# Patient Record
Sex: Female | Born: 1995 | Race: White | Hispanic: No | Marital: Single | Smoking: Never smoker
Health system: Southern US, Community
[De-identification: ages and names within clinical notes are randomized; demographics above are authoritative.]

## PROBLEM LIST (undated history)

## (undated) DIAGNOSIS — J45909 Unspecified asthma, uncomplicated: Secondary | ICD-10-CM

## (undated) DIAGNOSIS — R55 Syncope and collapse: Secondary | ICD-10-CM

## (undated) HISTORY — DX: Syncope and collapse: R55

## (undated) HISTORY — PX: NO PAST SURGERIES: SHX2092

---
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
## 2015-07-31 ENCOUNTER — Encounter (HOSPITAL_COMMUNITY): Payer: Self-pay | Admitting: Emergency Medicine

## 2015-07-31 ENCOUNTER — Emergency Department (HOSPITAL_COMMUNITY)
Admission: EM | Admit: 2015-07-31 | Discharge: 2015-07-31 | Disposition: A | Discharge status: Home / Self Care | Payer: Medicaid Other | Attending: Emergency Medicine | Admitting: Emergency Medicine

## 2015-07-31 ENCOUNTER — Emergency Department (HOSPITAL_COMMUNITY): Payer: Medicaid Other

## 2015-07-31 DIAGNOSIS — Z79899 Other long term (current) drug therapy: Secondary | ICD-10-CM | POA: Diagnosis not present

## 2015-07-31 DIAGNOSIS — Z880 Allergy status to penicillin: Secondary | ICD-10-CM | POA: Diagnosis not present

## 2015-07-31 DIAGNOSIS — R079 Chest pain, unspecified: Secondary | ICD-10-CM | POA: Diagnosis not present

## 2015-07-31 DIAGNOSIS — L02212 Cutaneous abscess of back [any part, except buttock]: Secondary | ICD-10-CM | POA: Diagnosis present

## 2015-07-31 DIAGNOSIS — J45909 Unspecified asthma, uncomplicated: Secondary | ICD-10-CM | POA: Diagnosis not present

## 2015-07-31 DIAGNOSIS — L0291 Cutaneous abscess, unspecified: Secondary | ICD-10-CM

## 2015-07-31 MED ORDER — MORPHINE SULFATE (PF) 4 MG/ML IV SOLN
4.0000 mg | Freq: Once | INTRAVENOUS | Status: AC
  Administered 2015-07-31: 4 mg via INTRAVENOUS
  Filled 2015-07-31: qty 1

## 2015-07-31 MED ORDER — NAPROXEN 500 MG PO TABS: 500.0000 mg | ORAL_TABLET | Freq: Two times a day (BID) | ORAL | Status: DC

## 2015-07-31 MED ORDER — LIDOCAINE-EPINEPHRINE (PF) 2 %-1:200000 IJ SOLN
20.0000 mL | Freq: Once | INTRAMUSCULAR | Status: AC
  Administered 2015-07-31: 20 mL
  Filled 2015-07-31: qty 20

## 2015-07-31 NOTE — ED Notes (Addendum)
Pt states that she has a boil between her thigh and buttocks that has been present for 3 days.  Also reports chest pain that started last night, intermittent in nature, worsens with movement, and is not currently present.

## 2015-07-31 NOTE — ED Provider Notes (Signed)
THIS IS A SHARED VISIT WITH DR. Jonny Ruiz KNAPP  Emma Cooper is a 20 y.o. female with an abscess to the left buttock.   BP 116/65 mmHg  Pulse 96  Temp(Src) 97.6 F (36.4 C) (Oral)  Resp 16  Ht  (1.549 m)  Wt 68.04 kg  BMI 28.36 kg/m2  SpO2 99%  LMP 07/17/2015   INCISION AND DRAINAGE Performed by: Dariel Betzer Consent: Verbal consent obtained. Risks and benefits: risks, benefits and alternatives were discussed Type: abscess  Body area: left buttock  Cleaned and prepped   Draped   Anesthesia: local infiltration  Local anesthetic: lidocaine 2% with epinephrine  Anesthetic total: 4 ml  Complexity: complex Blunt dissection to break up loculations  Irrigated with NSS  Drainage: purulent  Drainage amount: moderate  Packing material: 1/4 in iodoform gauze  Patient tolerance: Patient tolerated the procedure well with no immediate complications.     [id number] [place], NP 07/31/15 2020  Linwood Dibbles, MD 07/31/15 [phone]

## 2015-07-31 NOTE — ED Notes (Signed)
Pt states understanding of care given and follow up instructions 

## 2015-07-31 NOTE — ED Provider Notes (Signed)
CSN: [id number]     Arrival date & time 07/31/15  1542 History   First MD Initiated Contact with Patient 07/31/15 1843     Chief Complaint  Patient presents with  . Abscess   HPI Patient presents to emergency room with complaints of 2 different issues. The first thing she mentioned was having an episode of chest pain yesterday. It started last night. It was sharp. Intermittent nature. It would increase intermittently with movement. She is not having any trouble with coughing. No shortness of breath. No leg swelling. She does not have a history of heart disease or pulmonary embolism. Today the symptoms are better although she has noticed some intermittent sharp pain.  Patient also has been complaining of a sore area on the right but. Started a few days ago. It increased in severity over the last day. She denies any fevers or chills. Is very painful to sit on that area. Past Medical History  Diagnosis Date  . Asthma    History reviewed. No pertinent past surgical history. History reviewed. No pertinent family history. Social History  Substance Use Topics  . Smoking status: Never Smoker   . Smokeless tobacco: Never Used  . Alcohol Use: No   OB History    Gravida Para Term Preterm AB TAB SAB Ectopic Multiple Living   0 0 0 0 0 0 0 0       Review of Systems  All other systems reviewed and are negative.     Allergies  Amoxicillin and Penicillins  Home Medications   Prior to Admission medications   Medication Sig Start Date End Date Taking? Authorizing Provider  albuterol (PROVENTIL HFA;VENTOLIN HFA) 108 (90 BASE) MCG/ACT inhaler Inhale 2 puffs into the lungs every 4 (four) hours as needed for wheezing. 05/31/12  Yes Ivery Quale, PA-C  BIOTIN PO Take 1 tablet by mouth daily.   Yes Historical Provider, MD  albuterol (PROVENTIL HFA;VENTOLIN HFA) 108 (90 BASE) MCG/ACT inhaler Inhale 2 puffs into the lungs every 4 (four) hours as needed for wheezing or shortness of breath. 03/22/14    John Molpus, MD  albuterol (PROVENTIL) (5 MG/ML) 0.5% nebulizer solution Take 2.5 mg by nebulization every 6 (six) hours as needed for wheezing or shortness of breath.    Historical Provider, MD  diphenhydrAMINE (BENADRYL) 25 MG tablet Take 1 tablet (25 mg total) by mouth every 4 (four) hours as needed for itching. 10/25/14   Tammy Triplett, PA-C  naproxen (NAPROSYN) 500 MG tablet Take 1 tablet (500 mg total) by mouth 2 (two) times daily. 07/31/15   Linwood Dibbles, MD  predniSONE (DELTASONE) 20 MG tablet Take two tablets po qd x 5 days 10/25/14   Tammy Triplett, PA-C   BP 116/65 mmHg  Pulse 96  Temp(Src) 97.6 F (36.4 C) (Oral)  Resp 16  Ht  (1.549 m)  Wt 68.04 kg  BMI 28.36 kg/m2  SpO2 99%  LMP 07/17/2015 Physical Exam  Constitutional: She appears well-developed and well-nourished. No distress.  HENT:  Head: Normocephalic and atraumatic.  Right Ear: External ear normal.  Left Ear: External ear normal.  Eyes: Conjunctivae are normal. Right eye exhibits no discharge. Left eye exhibits no discharge. No scleral icterus.  Neck: Neck supple. No tracheal deviation present.  Cardiovascular: Normal rate, regular rhythm and intact distal pulses.   Pulmonary/Chest: Effort normal and breath sounds normal. No stridor. No respiratory distress. She has no wheezes. She has no rales.  Abdominal: Soft. Bowel sounds are normal. She exhibits  no distension. There is no tenderness. There is no rebound and no guarding.  Musculoskeletal: She exhibits no edema or tenderness.  Area of induration and overlying erythema left inferior aspect of the buttock, no fluctuance  Neurological: She is alert. She has normal strength. No cranial nerve deficit (no facial droop, extraocular movements intact, no slurred speech) or sensory deficit. She exhibits normal muscle tone. She displays no seizure activity. Coordination normal.  Skin: Skin is warm and dry. No rash noted.  Psychiatric: She has a normal mood and affect.   Nursing note and vitals reviewed.   ED Course  Procedures (including critical care time) EMERGENCY DEPARTMENT US SOFT TISSUE INTERPRETATION "Study: Limited Ultrasound of the noted body part in comments below" INDICATIONS: Pain Multiple views of the body part are obtained with a multi-frequency linear probe PERFORMED BY:  Myself IMAGES ARCHIVED?: Yes SIDE:Right  BODY PART:Other soft tisse (comment in note)buttock FINDINGS: Abcess present, complex collection LIMITATIONS:  none INTERPRETATION:  Abcess present COMMENT:  Plan on I&D   Labs Review Labs Reviewed - No data to display  Imaging Review Dg Chest 2 View  07/31/2015  CLINICAL DATA:  20 year old with 1 week history of nonproductive cough and shortness of breath. Acute onset left-sided chest pain yesterday. Current history of asthma. EXAM: CHEST  2 VIEW COMPARISON:  None. FINDINGS: Cardiomediastinal silhouette unremarkable. Lungs clear. Bronchovascular markings normal. Pulmonary vascularity normal. No visible pleural effusions. No pneumothorax. Visualized bony thorax intact. IMPRESSION: Normal examination.  Will Electronically Signed   By: Hulan Saas M.D.   On: 07/31/2015 19:58   I have personally reviewed and evaluated these images and lab results as part of my medical decision-making.   EKG Interpretation   Date/Time:  Saturday July 31 2015 19:13:47 EST Ventricular Rate:  93 PR Interval:  141 QRS Duration: 94 QT Interval:  374 QTC Calculation: 465 R Axis:   69 Text Interpretation:  Sinus rhythm Baseline wander in lead(s) III aVF No  old tracing to compare Confirmed by Kashton Mcartor  MD-J, Franchelle Foskett ([id number]) on 07/31/2015  7:22:35 PM      MDM   Final diagnoses:  Chest pain, unspecified chest pain type  Abscess    Patient's abscess was drained by NP Neese. She tolerated the procedure well.  Patient's chest x-ray and EKG are unremarkable. I doubt any serious cause for her chest pain such as acute coronary syndrome or  pulmonary embolism.    Linwood Dibbles, MD 07/31/15 2043

## 2015-07-31 NOTE — Discharge Instructions (Signed)

## 2015-08-05 ENCOUNTER — Encounter (HOSPITAL_BASED_OUTPATIENT_CLINIC_OR_DEPARTMENT_OTHER): Payer: Self-pay | Admitting: Emergency Medicine

## 2015-08-05 ENCOUNTER — Emergency Department (HOSPITAL_BASED_OUTPATIENT_CLINIC_OR_DEPARTMENT_OTHER)
Admission: EM | Admit: 2015-08-05 | Discharge: 2015-08-05 | Disposition: A | Discharge status: Home / Self Care | Payer: Medicaid Other | Attending: Emergency Medicine | Admitting: Emergency Medicine

## 2015-08-05 DIAGNOSIS — Z79899 Other long term (current) drug therapy: Secondary | ICD-10-CM | POA: Diagnosis not present

## 2015-08-05 DIAGNOSIS — Z880 Allergy status to penicillin: Secondary | ICD-10-CM | POA: Insufficient documentation

## 2015-08-05 DIAGNOSIS — L0231 Cutaneous abscess of buttock: Secondary | ICD-10-CM | POA: Insufficient documentation

## 2015-08-05 DIAGNOSIS — Z791 Long term (current) use of non-steroidal anti-inflammatories (NSAID): Secondary | ICD-10-CM | POA: Insufficient documentation

## 2015-08-05 DIAGNOSIS — J45909 Unspecified asthma, uncomplicated: Secondary | ICD-10-CM | POA: Diagnosis not present

## 2015-08-05 MED ORDER — LIDOCAINE-EPINEPHRINE 2 %-1:100000 IJ SOLN
20.0000 mL | Freq: Once | INTRAMUSCULAR | Status: AC
  Administered 2015-08-05: 20 mL
  Filled 2015-08-05: qty 1

## 2015-08-05 MED ORDER — SULFAMETHOXAZOLE-TRIMETHOPRIM 800-160 MG PO TABS
1.0000 | ORAL_TABLET | Freq: Once | ORAL | Status: AC
  Administered 2015-08-05: 1 via ORAL
  Filled 2015-08-05: qty 1

## 2015-08-05 MED ORDER — HYDROCODONE-ACETAMINOPHEN 5-325 MG PO TABS: 1.0000 | ORAL_TABLET | Freq: Four times a day (QID) | ORAL | Status: DC | PRN

## 2015-08-05 MED ORDER — HYDROCODONE-ACETAMINOPHEN 5-325 MG PO TABS
1.0000 | ORAL_TABLET | Freq: Once | ORAL | Status: AC
  Administered 2015-08-05: 1 via ORAL
  Filled 2015-08-05: qty 1

## 2015-08-05 MED ORDER — SULFAMETHOXAZOLE-TRIMETHOPRIM 800-160 MG PO TABS: 1.0000 | ORAL_TABLET | Freq: Two times a day (BID) | ORAL | Status: AC

## 2015-08-05 NOTE — ED Notes (Signed)
Pt reports seen at [facility] ED last Saturday and had abscess left buttock drained but is returning and pain is increasing

## 2015-08-05 NOTE — ED Provider Notes (Signed)
CSN: [id number]     Arrival date & time 08/05/15  0410 History   First MD Initiated Contact with Patient 08/05/15 [phone]     Chief Complaint  Patient presents with  . Abscess     (Consider location/radiation/quality/duration/timing/severity/associated sxs/prior Treatment) HPI  This is a 20 year old female who had an abscess of the inferiomedial left buttock 5 days ago. The wound was packed with quarter-inch iodoform gauze. The abscess has reaccumulated since then and she is now having moderate to severe pain at the site with increased swelling. She removed the packing herself earlier this week. She has not had a fever.  Past Medical History  Diagnosis Date  . Asthma    History reviewed. No pertinent past surgical history. History reviewed. No pertinent family history. Social History  Substance Use Topics  . Smoking status: Never Smoker   . Smokeless tobacco: Never Used  . Alcohol Use: No   OB History    Gravida Para Term Preterm AB TAB SAB Ectopic Multiple Living   0 0 0 0 0 0 0 0       Review of Systems  All other systems reviewed and are negative.   Allergies  Amoxicillin and Penicillins  Home Medications   Prior to Admission medications   Medication Sig Start Date End Date Taking? Authorizing Provider  albuterol (PROVENTIL HFA;VENTOLIN HFA) 108 (90 BASE) MCG/ACT inhaler Inhale 2 puffs into the lungs every 4 (four) hours as needed for wheezing. 05/31/12   Ivery Quale, PA-C  albuterol (PROVENTIL HFA;VENTOLIN HFA) 108 (90 BASE) MCG/ACT inhaler Inhale 2 puffs into the lungs every 4 (four) hours as needed for wheezing or shortness of breath. 03/22/14   Trinitie Mcgirr, MD  albuterol (PROVENTIL) (5 MG/ML) 0.5% nebulizer solution Take 2.5 mg by nebulization every 6 (six) hours as needed for wheezing or shortness of breath.    Historical Provider, MD  BIOTIN PO Take 1 tablet by mouth daily.    Historical Provider, MD  diphenhydrAMINE (BENADRYL) 25 MG tablet Take 1 tablet (25 mg  total) by mouth every 4 (four) hours as needed for itching. 10/25/14   Tammy Triplett, PA-C  naproxen (NAPROSYN) 500 MG tablet Take 1 tablet (500 mg total) by mouth 2 (two) times daily. 07/31/15   Linwood Dibbles, MD  predniSONE (DELTASONE) 20 MG tablet Take two tablets po qd x 5 days 10/25/14   Tammy Triplett, PA-C   BP 127/91 mmHg  Pulse 76  Temp(Src) 97.5 F (36.4 C)  Resp 18  SpO2 100%  LMP 07/17/2015   Physical Exam  General: Well-developed, well-nourished female in no acute distress; appearance consistent with age of record HENT: normocephalic; atraumatic Eyes: Normal appearance Neck: supple Heart: regular rate and rhythm Lungs: Normal respiratory effort and excursion Abdomen: soft; nondistended Extremities: No deformity; full range of motion Neurologic: Awake, alert and oriented; motor function intact in all extremities and symmetric; no facial droop Skin: Warm and dry; tender, indurated area of the inferomedial aspect of the left buttock with small puncture wound near the center Psychiatric: Normal mood and affect     ED Course  Procedures (including critical care time)  INCISION AND DRAINAGE Performed by: Hanley Seamen Consent: Verbal consent obtained. Risks and benefits: risks, benefits and alternatives were discussed Type: abscess  Body area: Left buttock  Anesthesia: local infiltration  Incision was made with a scalpel (extension of previous small incision).  Local anesthetic: lidocaine 2 % with epinephrine  Anesthetic total: 3 ml  Complexity: complex Blunt dissection to  break up loculations  Drainage: purulent  Drainage amount: Copious   Packing material: 1/2 in iodoform gauze  Patient tolerance: Patient tolerated the procedure well with no immediate complications.     MDM  Will place on antibiotics in light of failure of initial I&D.     Paula Libra, MD 08/05/15 [phone]

## 2015-08-05 NOTE — Discharge Instructions (Signed)
Incision and Drainage Incision and drainage is a procedure in which a sac-like structure (cystic structure) is opened and drained. The area to be drained usually contains material such as pus, fluid, or blood.  LET YOUR CAREGIVER KNOW ABOUT:   Allergies to medicine.  Medicines taken, including vitamins, herbs, eyedrops, over-the-counter medicines, and creams.  Use of steroids (by mouth or creams).  Previous problems with anesthetics or numbing medicines.  History of bleeding problems or blood clots.  Previous surgery.  Other health problems, including diabetes and kidney problems.  Possibility of pregnancy, if this applies. RISKS AND COMPLICATIONS  Pain.  Bleeding.  Scarring.  Infection. BEFORE THE PROCEDURE  You may need to have an ultrasound or other imaging tests to see how large or deep your cystic structure is. Blood tests may also be used to determine if you have an infection or how severe the infection is. You may need to have a tetanus shot. PROCEDURE  The affected area is cleaned with a cleaning fluid. The cyst area will then be numbed with a medicine (local anesthetic). A small incision will be made in the cystic structure. A syringe or catheter may be used to drain the contents of the cystic structure, or the contents may be squeezed out. The area will then be flushed with a cleansing solution. After cleansing the area, it is often gently packed with a gauze or another wound dressing. Once it is packed, it will be covered with gauze and tape or some other type of wound dressing. AFTER THE PROCEDURE   Often, you will be allowed to go home right after the procedure.  You may be given antibiotic medicine to prevent or heal an infection.  If the area was packed with gauze or some other wound dressing, you may remove it in 2-3 days.  The area should heal in about 14 days.   This information is not intended to replace advice given to you by your health care provider.  Make sure you discuss any questions you have with your health care provider.   Document Released: 11/29/2000 Document Revised: 12/05/2011 Document Reviewed: 07/31/2011 Elsevier Interactive Patient Education [employer]

## 2016-11-22 ENCOUNTER — Emergency Department
Admission: EM | Admit: 2016-11-22 | Discharge: 2016-11-22 | Disposition: A | Discharge status: Home / Self Care | Payer: Medicaid Other | Attending: Emergency Medicine | Admitting: Emergency Medicine

## 2016-11-22 ENCOUNTER — Encounter: Payer: Self-pay | Admitting: Emergency Medicine

## 2016-11-22 DIAGNOSIS — Z79899 Other long term (current) drug therapy: Secondary | ICD-10-CM | POA: Insufficient documentation

## 2016-11-22 DIAGNOSIS — J029 Acute pharyngitis, unspecified: Secondary | ICD-10-CM | POA: Insufficient documentation

## 2016-11-22 DIAGNOSIS — J45909 Unspecified asthma, uncomplicated: Secondary | ICD-10-CM | POA: Insufficient documentation

## 2016-11-22 DIAGNOSIS — Y929 Unspecified place or not applicable: Secondary | ICD-10-CM | POA: Insufficient documentation

## 2016-11-22 DIAGNOSIS — W57XXXA Bitten or stung by nonvenomous insect and other nonvenomous arthropods, initial encounter: Secondary | ICD-10-CM | POA: Insufficient documentation

## 2016-11-22 DIAGNOSIS — Y999 Unspecified external cause status: Secondary | ICD-10-CM | POA: Insufficient documentation

## 2016-11-22 DIAGNOSIS — S50861A Insect bite (nonvenomous) of right forearm, initial encounter: Secondary | ICD-10-CM | POA: Insufficient documentation

## 2016-11-22 DIAGNOSIS — Y939 Activity, unspecified: Secondary | ICD-10-CM | POA: Insufficient documentation

## 2016-11-22 MED ORDER — DIPHENHYDRAMINE HCL 25 MG PO CAPS: 25.0000 mg | ORAL_CAPSULE | ORAL | 0 refills | Status: DC | PRN

## 2016-11-22 MED ORDER — DOXYCYCLINE HYCLATE 50 MG PO CAPS: 100.0000 mg | ORAL_CAPSULE | Freq: Two times a day (BID) | ORAL | 0 refills | Status: AC

## 2016-11-22 NOTE — ED Provider Notes (Signed)
[facility] Emergency Department Provider Note  ____________________________________________  Time seen: Approximately 2:09 PM  I have reviewed the triage vital signs and the nursing notes.   HISTORY  Chief Complaint Insect Bite    HPI Emma Cooper is a 21 y.o. female that presents to the emergency department with rash on right forearm for 4 days. Patient states that she was bit by a tick. She never saw a tick nor removed a tick, but she states that she is pretty sure it was a baby tick. There are lots of ticks where she lives. She is sure it was a tick bite because the center has a "white dot, just like a tick bite does." She also has had a sore throat since this morning. She has had strep before and states that this is definitely not it. He isn't allergic to amoxicillin and penicillin. She denies fever, muscle aches, shortness of breath, chest pain, nausea, vomiting, abdominal pain.   Past Medical History:  Diagnosis Date  . Asthma     There are no active problems to display for this patient.   No past surgical history on file.  Prior to Admission medications   Medication Sig Start Date End Date Taking? Authorizing Provider  albuterol (PROVENTIL HFA;VENTOLIN HFA) 108 (90 BASE) MCG/ACT inhaler Inhale 2 puffs into the lungs every 4 (four) hours as needed for wheezing. 05/31/12   Ivery Quale, PA-C  albuterol (PROVENTIL HFA;VENTOLIN HFA) 108 (90 BASE) MCG/ACT inhaler Inhale 2 puffs into the lungs every 4 (four) hours as needed for wheezing or shortness of breath. 03/22/14   Molpus, John, MD  albuterol (PROVENTIL) (5 MG/ML) 0.5% nebulizer solution Take 2.5 mg by nebulization every 6 (six) hours as needed for wheezing or shortness of breath.    [provider]  BIOTIN PO Take 1 tablet by mouth daily.    [provider]  diphenhydrAMINE (BENADRYL) 25 mg capsule Take 1 capsule (25 mg total) by mouth every 4 (four) hours as needed. 11/22/16  11/22/17  Enid Derry, PA-C  doxycycline (VIBRAMYCIN) 50 MG capsule Take 2 capsules (100 mg total) by mouth 2 (two) times daily. 11/22/16 12/02/16  Enid Derry, PA-C  HYDROcodone-acetaminophen (NORCO) 5-325 MG tablet Take 1-2 tablets by mouth every 6 (six) hours as needed (for pain). 08/05/15   Molpus, John, MD  naproxen (NAPROSYN) 500 MG tablet Take 1 tablet (500 mg total) by mouth 2 (two) times daily. 07/31/15   Linwood Dibbles, MD  predniSONE (DELTASONE) 20 MG tablet Take two tablets po qd x 5 days 10/25/14   Trisha Mangle, Tammy, PA-C    Allergies Amoxicillin and Penicillins  No family history on file.  Social History Social History  Substance Use Topics  . Smoking status: Never Smoker  . Smokeless tobacco: Never Used  . Alcohol use No     Review of Systems  Constitutional: No fever/chills Cardiovascular: No chest pain. Respiratory: No SOB. Gastrointestinal: No abdominal pain.  No nausea, no vomiting.  Musculoskeletal: Negative for musculoskeletal pain. Skin: Negative for abrasions, lacerations, ecchymosis. Positive for rash. Neurological: Negative for headaches, numbness or tingling   ____________________________________________   PHYSICAL EXAM:  VITAL SIGNS: ED Triage Vitals  Enc Vitals Group     BP 11/22/16 1314 121/66     Pulse Rate 11/22/16 1314 76     Resp 11/22/16 1314 18     Temp 11/22/16 1314 97.6 F (36.4 C)     Temp Source 11/22/16 1314 Oral     SpO2  11/22/16 1314 100 %     Weight 11/22/16 1315 125 lb (56.7 kg)     Height 11/22/16 1315 5\' 2"  (1.575 m)     Head Circumference --      Peak Flow --      Pain Score --      Pain Loc --      Pain Edu? --      Excl. in GC? --      Constitutional: Alert and oriented. Well appearing and in no acute distress. Eyes: Conjunctivae are normal. PERRL. EOMI. Head: Atraumatic. ENT:      Ears:      Nose: No congestion/rhinnorhea.      Mouth/Throat: Mucous membranes are moist. Oropharynx non-erythematous. Tonsils not  enlarged. Uvula midline. Neck: No stridor.  Cardiovascular: Normal rate, regular rhythm.  Good peripheral circulation. Respiratory: Normal respiratory effort without tachypnea or retractions. Lungs CTAB. Good air entry to the bases with no decreased or absent breath sounds. Gastrointestinal: Bowel sounds 4 quadrants. Soft and nontender to palpation. No guarding or rigidity. No palpable masses. No distention.  Musculoskeletal: Full range of motion to all extremities. No gross deformities appreciated. Neurologic:  Normal speech and language. No gross focal neurologic deficits are appreciated.  Skin:  Skin is warm, dry and intact. 2 mm wheal to left forearm with 1 inch surrounding erythema.   ____________________________________________   LABS (all labs ordered are listed, but only abnormal results are displayed)  Labs Reviewed - No data to display ____________________________________________  EKG   ____________________________________________  RADIOLOGY  No results found.  ____________________________________________    PROCEDURES  Procedure(s) performed:    Procedures    Medications - No data to display   ____________________________________________   INITIAL IMPRESSION / ASSESSMENT AND PLAN / ED COURSE  Pertinent labs & imaging results that were available during my care of the patient were reviewed by me and considered in my medical decision making (see chart for details).  Review of the [place] CSRS was performed in accordance of the [facility] prior to dispensing any controlled drugs.   Patient's diagnosis is consistent with insect bite and pharyngitis. Vital signs and exam are reassuring. No signs of systemic reaction. Patient has had a sore throat for 1 day and is likely viral. Oropharynx nonerythematous, tonsils not enlarged, and uvula midline. She does not want to do a strep test. Patient will be discharged home with prescriptions for doxycycline and benadryl. I  educated patient that I would try Benadryl before starting the doxycycline but I would write a prescription for doxycycline if rash on forearm worsens. No concern for tick borne illnesses as tick was never attached. Patient is to follow up with PCP as directed. Patient is given ED precautions to return to the ED for any worsening or new symptoms.     ____________________________________________  FINAL CLINICAL IMPRESSION(S) / ED DIAGNOSES  Final diagnoses:  Insect bite, initial encounter  Pharyngitis, unspecified etiology      NEW MEDICATIONS STARTED DURING THIS VISIT:  Discharge Medication List as of 11/22/2016  2:21 PM    START taking these medications   Details  diphenhydrAMINE (BENADRYL) 25 mg capsule Take 1 capsule (25 mg total) by mouth every 4 (four) hours as needed., Starting Wed 11/22/2016, Until Thu 11/22/2017, Print    doxycycline (VIBRAMYCIN) 50 MG capsule Take 2 capsules (100 mg total) by mouth 2 (two) times daily., Starting Wed 11/22/2016, Until Sat 12/02/2016, Print            This  chart was dictated using voice recognition software/Dragon. Despite best efforts to proofread, errors can occur which can change the meaning. Any change was purely unintentional.    Enid Derry, PA-C 11/22/16 1605    Jeanmarie Plant, MD 11/23/16 1110

## 2016-11-22 NOTE — ED Triage Notes (Signed)
Pt reports insect bite to right forearm. Pt states she does not know the type of insect that bit her. States she thinks it was 3-4 days ago. Pt states since then she has had body aches and sore throat. No apparent distress noted in triage.

## 2016-11-22 NOTE — ED Notes (Signed)
Red raised area with rash right forearm. Pt thinks she was bitten by tick 2-3 days ago.

## 2017-05-06 ENCOUNTER — Emergency Department: Payer: Self-pay

## 2017-05-06 ENCOUNTER — Other Ambulatory Visit: Payer: Self-pay

## 2017-05-06 ENCOUNTER — Encounter: Payer: Self-pay | Admitting: *Deleted

## 2017-05-06 ENCOUNTER — Emergency Department
Admission: EM | Admit: 2017-05-06 | Discharge: 2017-05-06 | Disposition: A | Discharge status: Home / Self Care | Payer: Self-pay | Attending: Emergency Medicine | Admitting: Emergency Medicine

## 2017-05-06 DIAGNOSIS — J189 Pneumonia, unspecified organism: Secondary | ICD-10-CM | POA: Insufficient documentation

## 2017-05-06 DIAGNOSIS — J45909 Unspecified asthma, uncomplicated: Secondary | ICD-10-CM | POA: Insufficient documentation

## 2017-05-06 DIAGNOSIS — R05 Cough: Secondary | ICD-10-CM

## 2017-05-06 DIAGNOSIS — R0981 Nasal congestion: Secondary | ICD-10-CM

## 2017-05-06 DIAGNOSIS — Z79899 Other long term (current) drug therapy: Secondary | ICD-10-CM | POA: Insufficient documentation

## 2017-05-06 DIAGNOSIS — R059 Cough, unspecified: Secondary | ICD-10-CM

## 2017-05-06 LAB — POCT RAPID STREP A: STREPTOCOCCUS, GROUP A SCREEN (DIRECT): NEGATIVE

## 2017-05-06 MED ORDER — PREDNISONE 20 MG PO TABS
60.0000 mg | ORAL_TABLET | Freq: Once | ORAL | Status: AC
  Administered 2017-05-06: 60 mg via ORAL
  Filled 2017-05-06: qty 3

## 2017-05-06 MED ORDER — LEVOFLOXACIN IN D5W 750 MG/150ML IV SOLN
750.0000 mg | Freq: Once | INTRAVENOUS | Status: DC
  Administered 2017-05-06: 750 mg via INTRAVENOUS
  Filled 2017-05-06: qty 150

## 2017-05-06 MED ORDER — LEVOFLOXACIN 750 MG PO TABS
750.0000 mg | ORAL_TABLET | Freq: Once | ORAL | Status: AC
  Administered 2017-05-06: 750 mg via ORAL
  Filled 2017-05-06: qty 1

## 2017-05-06 MED ORDER — GUAIFENESIN-CODEINE 100-10 MG/5ML PO SOLN: 5.0000 mL | Freq: Three times a day (TID) | ORAL | 0 refills | Status: DC | PRN

## 2017-05-06 MED ORDER — PREDNISONE 10 MG (21) PO TBPK: ORAL_TABLET | ORAL | 0 refills | Status: DC

## 2017-05-06 MED ORDER — GUAIFENESIN-CODEINE 100-10 MG/5ML PO SOLN
5.0000 mL | Freq: Once | ORAL | Status: AC
  Administered 2017-05-06: 5 mL via ORAL
  Filled 2017-05-06: qty 5

## 2017-05-06 MED ORDER — LEVOFLOXACIN 750 MG PO TABS: 750.0000 mg | ORAL_TABLET | Freq: Every day | ORAL | 0 refills | Status: AC

## 2017-05-06 MED ORDER — CLINDAMYCIN HCL 300 MG PO CAPS
300.0000 mg | ORAL_CAPSULE | Freq: Three times a day (TID) | ORAL | 0 refills | Status: DC
Start: 2017-05-06 — End: 2017-05-06

## 2017-05-06 NOTE — ED Provider Notes (Signed)
[facility] Emergency Department Provider Note   ____________________________________________   I have reviewed the triage vital signs and the nursing notes.   HISTORY  Chief Complaint Fever and Nasal Congestion    HPI Emma Cooper is a 21 y.o. female presents emergency department with cough, nasal congestion, rhinorrhea, fever, malaise and headache for 1 week.  Patient experienced symptoms 2 weeks ago resolving quickly however symptoms returned and she has felt much worse with continuous fever despite taking the counter medications.  Patient reports medical history of asthma without recent exacerbation.  Patient denies any past history of pneumonia.  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  Patient has noted wheezing however denies shortness of breath or difficulty breathing. Patient denies fever, chills, headache, vision changes, chest pain, chest tightness, shortness of breath, abdominal pain, nausea and vomiting.  Past Medical History:  Diagnosis Date  . Asthma     There are no active problems to display for this patient.   History reviewed. No pertinent surgical history.  Prior to Admission medications   Medication Sig Start Date End Date Taking? Authorizing Provider  albuterol (PROVENTIL HFA;VENTOLIN HFA) 108 (90 BASE) MCG/ACT inhaler Inhale 2 puffs into the lungs every 4 (four) hours as needed for wheezing. 05/31/12   Ivery QualeBryant, Hobson, PA-C  albuterol (PROVENTIL HFA;VENTOLIN HFA) 108 (90 BASE) MCG/ACT inhaler Inhale 2 puffs into the lungs every 4 (four) hours as needed for wheezing or shortness of breath. 03/22/14   Molpus, John, MD  albuterol (PROVENTIL) (5 MG/ML) 0.5% nebulizer solution Take 2.5 mg by nebulization every 6 (six) hours as needed for wheezing or shortness of breath.    [provider]  BIOTIN PO Take 1 tablet by mouth daily.    [provider]  diphenhydrAMINE (BENADRYL) 25 mg capsule Take 1 capsule  (25 mg total) by mouth every 4 (four) hours as needed. 11/22/16 11/22/17  Enid DerryWagner, Ashley, PA-C  guaiFENesin-codeine 100-10 MG/5ML syrup Take 5 mLs 3 (three) times daily as needed by mouth for cough. 05/06/17   Tiki Tucciarone M, PA-C  HYDROcodone-acetaminophen (NORCO) 5-325 MG tablet Take 1-2 tablets by mouth every 6 (six) hours as needed (for pain). 08/05/15   Molpus, Jonny RuizJohn, MD  levofloxacin (LEVAQUIN) 750 MG tablet Take 1 tablet (750 mg total) daily for 7 days by mouth. 05/06/17 05/13/17  Severus Brodzinski M, PA-C  naproxen (NAPROSYN) 500 MG tablet Take 1 tablet (500 mg total) by mouth 2 (two) times daily. 07/31/15   Linwood DibblesKnapp, Jon, MD  predniSONE (STERAPRED UNI-PAK 21 TAB) 10 MG (21) TBPK tablet Take 6 tablets on day 1. Take 5 tablets on day 2. Take 4 tablets on day 3. Take 3 tablets on day 4. Take 2 tablets on day 5. Take 1 tablets on day 6. 05/06/17   Clester Chlebowski M, PA-C    Allergies Amoxicillin and Penicillins  History reviewed. No pertinent family history.  Social History Social History   Tobacco Use  . Smoking status: Never Smoker  . Smokeless tobacco: Never Used  Substance Use Topics  . Alcohol use: No  . Drug use: No    Review of Systems Constitutional: Negative for fever/chills Eyes: No visual changes. ENT:  Negative for sore throat and for difficulty swallowing Cardiovascular: Denies chest pain. Respiratory: Denies cough. Denies shortness of breath. Gastrointestinal: No abdominal pain.  No nausea, vomiting, diarrhea. Genitourinary: Negative for dysuria. Musculoskeletal: Negative for back pain. Skin: Negative for rash. Neurological: Negative for headaches.  Negative focal  weakness or numbness. Negative for loss of consciousness. Able to ambulate. ____________________________________________   PHYSICAL EXAM:  VITAL SIGNS: ED Triage Vitals [05/06/17 1131]  Enc Vitals Group     BP 127/66     Pulse Rate (!) 110     Resp 16     Temp 98.7 F (37.1 C)     Temp Source Oral      SpO2 100 %     Weight 120 lb (54.4 kg)     Height 5\' 1"  (1.549 m)     Head Circumference      Peak Flow      Pain Score      Pain Loc      Pain Edu?      Excl. in GC?     Constitutional: Alert and oriented. Well appearing and in no acute distress.  Eyes: Conjunctivae are normal. PERRL. EOMI  Head: Normocephalic and atraumatic. ENT:      Ears: Canals clear. TMs intact bilaterally.      Nose: Nasal congestion/rhinnorhea.      Mouth/Throat: Mucous membranes are moist. Oropharynx erythematous. Tonsils bilaterally symmetrical Neck:Supple  No stridor.  Cardiovascular: Normal rate, regular rhythm.  Good peripheral circulation. Respiratory: Normal respiratory effort without tachypnea or retractions. Lungs CTAB. Wheezes bilateral lower lobes. Good air entry to the bases with no decreased or absent breath sounds. Musculoskeletal: Nontender with normal range of motion in all extremities. Neurologic: Normal speech and language.  Skin:  Skin is warm, dry and intact. No rash noted. Psychiatric: Mood and affect are normal. Speech and behavior are normal. Patient exhibits appropriate insight and judgement.  ____________________________________________   LABS (all labs ordered are listed, but only abnormal results are displayed)  Labs Reviewed  POCT RAPID STREP A   ____________________________________________  EKG none ____________________________________________  RADIOLOGY DG chest 2 view  FINDINGS: The cardiomediastinal silhouette is normal in size. Normal pulmonary vascularity. Patchy consolidation in the lingula. No pleural effusion or pneumothorax. No acute osseous abnormality.  IMPRESSION: Lingular pneumonia. ____________________________________________   PROCEDURES  Procedure(s) performed: no    Critical Care performed: no ____________________________________________   INITIAL IMPRESSION / ASSESSMENT AND PLAN / ED COURSE  Pertinent labs & imaging results that  were available during my care of the patient were reviewed by me and considered in my medical decision making (see chart for details).   Patient presents to the emergency department cough, nasal congestion, rhinorrhea, fever, malaise and headache for 1 week.  History, physical exam and imaging are reassuring symptoms are consistent with community acquired pneumonia. Patient will be prescribed levaquin for antibiotic coverage, prednisone taper and guaifenesin w/ codeine for cough. Recommend patient to utilize OTC cold and sinus for symptom management as needed. Physical exam and vital signes were reassuring at this time. Patient informed of clinical course, understand medical decision-making process, and agree with plan. Patient was advised to follow up with PCP as needed and was also advised to return to the emergency department for symptoms that change or worsen.       ____________________________________________   FINAL CLINICAL IMPRESSION(S) / ED DIAGNOSES  Final diagnoses:  Nasal congestion  Cough  Community acquired pneumonia, unspecified laterality       NEW MEDICATIONS STARTED DURING THIS VISIT:  This SmartLink is deprecated. Use AVSMEDLIST instead to display the medication list for a patient.   Note:  This document was prepared using Dragon voice recognition software and may include unintentional dictation errors.    Clois ComberLittle, Nasra Counce M, PA-C 05/06/17  0981    Myrna Blazer, MD 05/07/17 Serena Croissant

## 2017-05-06 NOTE — ED Triage Notes (Signed)
Pt to ED reporting congestion and productive cough x 3 days with a fever. Pt is afebrile upon arrival. Pt in NAD at this time and having no SOB or increased WOB.

## 2017-05-06 NOTE — Discharge Instructions (Addendum)
Take medication as prescribed. Return to emergency department if symptoms worsen and follow-up with PCP as needed.   °

## 2017-08-10 ENCOUNTER — Emergency Department: Payer: Medicaid Other

## 2017-08-10 ENCOUNTER — Emergency Department
Admission: EM | Admit: 2017-08-10 | Discharge: 2017-08-10 | Disposition: A | Discharge status: Home / Self Care | Payer: Medicaid Other | Attending: Emergency Medicine | Admitting: Emergency Medicine

## 2017-08-10 ENCOUNTER — Encounter: Payer: Self-pay | Admitting: Emergency Medicine

## 2017-08-10 DIAGNOSIS — O9989 Other specified diseases and conditions complicating pregnancy, childbirth and the puerperium: Secondary | ICD-10-CM | POA: Diagnosis not present

## 2017-08-10 DIAGNOSIS — H9319 Tinnitus, unspecified ear: Secondary | ICD-10-CM | POA: Insufficient documentation

## 2017-08-10 DIAGNOSIS — R102 Pelvic and perineal pain: Secondary | ICD-10-CM

## 2017-08-10 DIAGNOSIS — R109 Unspecified abdominal pain: Secondary | ICD-10-CM | POA: Insufficient documentation

## 2017-08-10 DIAGNOSIS — R232 Flushing: Secondary | ICD-10-CM | POA: Diagnosis not present

## 2017-08-10 DIAGNOSIS — Z3A09 9 weeks gestation of pregnancy: Secondary | ICD-10-CM | POA: Insufficient documentation

## 2017-08-10 DIAGNOSIS — R42 Dizziness and giddiness: Secondary | ICD-10-CM | POA: Diagnosis not present

## 2017-08-10 DIAGNOSIS — Z79899 Other long term (current) drug therapy: Secondary | ICD-10-CM | POA: Insufficient documentation

## 2017-08-10 DIAGNOSIS — H538 Other visual disturbances: Secondary | ICD-10-CM | POA: Diagnosis not present

## 2017-08-10 DIAGNOSIS — J45909 Unspecified asthma, uncomplicated: Secondary | ICD-10-CM | POA: Insufficient documentation

## 2017-08-10 DIAGNOSIS — Z3491 Encounter for supervision of normal pregnancy, unspecified, first trimester: Secondary | ICD-10-CM | POA: Insufficient documentation

## 2017-08-10 LAB — COMPREHENSIVE METABOLIC PANEL
ALT: 24 U/L (ref 14–54)
ANION GAP: 8 (ref 5–15)
AST: 27 U/L (ref 15–41)
Albumin: 4.3 g/dL (ref 3.5–5.0)
Alkaline Phosphatase: 37 U/L — ABNORMAL LOW (ref 38–126)
BUN: 8 mg/dL (ref 6–20)
CALCIUM: 9 mg/dL (ref 8.9–10.3)
CHLORIDE: 106 mmol/L (ref 101–111)
CO2: 23 mmol/L (ref 22–32)
Creatinine, Ser: 0.47 mg/dL (ref 0.44–1.00)
Glucose, Bld: 91 mg/dL (ref 65–99)
Potassium: 3.8 mmol/L (ref 3.5–5.1)
SODIUM: 137 mmol/L (ref 135–145)
Total Bilirubin: 0.7 mg/dL (ref 0.3–1.2)
Total Protein: 7.3 g/dL (ref 6.5–8.1)

## 2017-08-10 LAB — URINALYSIS, COMPLETE (UACMP) WITH MICROSCOPIC
Bilirubin Urine: NEGATIVE
GLUCOSE, UA: NEGATIVE mg/dL
HGB URINE DIPSTICK: NEGATIVE
KETONES UR: NEGATIVE mg/dL
Nitrite: NEGATIVE
PH: 7 (ref 5.0–8.0)
Protein, ur: NEGATIVE mg/dL
SPECIFIC GRAVITY, URINE: 1.018 (ref 1.005–1.030)

## 2017-08-10 LAB — CBC
HCT: 37.7 % (ref 35.0–47.0)
HEMOGLOBIN: 12.7 g/dL (ref 12.0–16.0)
MCH: 28.3 pg (ref 26.0–34.0)
MCHC: 33.8 g/dL (ref 32.0–36.0)
MCV: 83.9 fL (ref 80.0–100.0)
PLATELETS: 296 10*3/uL (ref 150–440)
RBC: 4.5 MIL/uL (ref 3.80–5.20)
RDW: 13.3 % (ref 11.5–14.5)
WBC: 8.4 10*3/uL (ref 3.6–11.0)

## 2017-08-10 LAB — POCT PREGNANCY, URINE: Preg Test, Ur: POSITIVE — AB

## 2017-08-10 LAB — HCG, QUANTITATIVE, PREGNANCY: hCG, Beta Chain, Quant, S: 365742 m[IU]/mL — ABNORMAL HIGH (ref <5)

## 2017-08-10 LAB — LIPASE, BLOOD: LIPASE: 34 U/L (ref 11–51)

## 2017-08-10 MED ORDER — PRENATAL 19 29-1 MG PO CHEW: 1.0000 | CHEWABLE_TABLET | Freq: Every day | ORAL | 1 refills | Status: DC

## 2017-08-10 MED ORDER — METOCLOPRAMIDE HCL 5 MG PO TABS: 5.0000 mg | ORAL_TABLET | Freq: Three times a day (TID) | ORAL | 0 refills | Status: DC | PRN

## 2017-08-10 NOTE — ED Triage Notes (Signed)
Pt comes into the ED via POV c/o abdominal cramping.  Patient is [redacted] weeks pregnant.  Patient states she also has episodes of dizziness that come and go.  Patient neurologically intact at this time and in NAD with even and unlabored respirations.  Denies any vaginal bleeding.

## 2017-08-10 NOTE — Discharge Instructions (Signed)
You exam, labs, and ultrasound are essentially normal at this time. Your ultrasound reveals a normal pregnancy. Start the prenatal multi-vitamin daily. Take the nausea medicine as needed. [facility] prenatal [facility] for [facility] care. Return to the ED for signs of vaginal bleeding.

## 2017-08-10 NOTE — ED Triage Notes (Signed)
First Nurse Note:  Arrives with c/o abdominal cramping and feeling lightheaded.  States she has been cramping throughout pregnancy, but today pain seemed worse.  Patient is AAOx3.  Skin warm and dry.  Ambulates with easy and steady gait.  NAD

## 2017-08-10 NOTE — ED Provider Notes (Signed)
[facility] Emergency Department Provider Note ____________________________________________  Time seen: 1604  I have reviewed the triage vital signs and the nursing notes.  HISTORY  Chief Complaint  Abdominal Pain  HPI Emma Cooper is a 22 y.o. female sent to the ED with complaints of abdominal cramping.  Patient reports to be about [redacted] weeks pregnant at this time.  She is G1P000, reports her LMP at 06/06/17, and has an EDC of 03/13/18.  Patient reports abdominal cramping as well as some lightheadedness patient denies any syncope, vertigo, dizziness, visual changes. She gives a history of "hot flashes" that she describes feeling warm all over. This prodrome usually precedes episodes of passing out, which lasts only seconds. She notes ear ringing and blurry vision. She recalls this history of episodic syncope goes back to elementary school, just at menarche. She also denies any vaginal bleeding,  vomiting, or diarrhea.  No fevers, chest pain, or shortness of breath have been reported.  Past Medical History:  Diagnosis Date  . Asthma     There are no active problems to display for this patient.  History reviewed. No pertinent surgical history.  Prior to Admission medications   Medication Sig Start Date End Date Taking? Authorizing Provider  albuterol (PROVENTIL HFA;VENTOLIN HFA) 108 (90 BASE) MCG/ACT inhaler Inhale 2 puffs into the lungs every 4 (four) hours as needed for wheezing. 05/31/12   Ivery Quale, PA-C  albuterol (PROVENTIL HFA;VENTOLIN HFA) 108 (90 BASE) MCG/ACT inhaler Inhale 2 puffs into the lungs every 4 (four) hours as needed for wheezing or shortness of breath. 03/22/14   Molpus, John, MD  albuterol (PROVENTIL) (5 MG/ML) 0.5% nebulizer solution Take 2.5 mg by nebulization every 6 (six) hours as needed for wheezing or shortness of breath.    [provider]  BIOTIN PO Take 1 tablet by mouth daily.    [provider]   diphenhydrAMINE (BENADRYL) 25 mg capsule Take 1 capsule (25 mg total) by mouth every 4 (four) hours as needed. 11/22/16 11/22/17  Enid Derry, PA-C  guaiFENesin-codeine 100-10 MG/5ML syrup Take 5 mLs 3 (three) times daily as needed by mouth for cough. 05/06/17   Little, Traci M, PA-C  HYDROcodone-acetaminophen (NORCO) 5-325 MG tablet Take 1-2 tablets by mouth every 6 (six) hours as needed (for pain). 08/05/15   Molpus, Jonny Ruiz, MD  metoCLOPramide (REGLAN) 5 MG tablet Take 1 tablet (5 mg total) by mouth every 8 (eight) hours as needed for nausea or vomiting. 08/10/17   Emilliano Dilworth, Charlesetta Ivory, PA-C  naproxen (NAPROSYN) 500 MG tablet Take 1 tablet (500 mg total) by mouth 2 (two) times daily. 07/31/15   Linwood Dibbles, MD  predniSONE (STERAPRED UNI-PAK 21 TAB) 10 MG (21) TBPK tablet Take 6 tablets on day 1. Take 5 tablets on day 2. Take 4 tablets on day 3. Take 3 tablets on day 4. Take 2 tablets on day 5. Take 1 tablets on day 6. 05/06/17   Little, Traci M, PA-C  Prenatal Vit-Fe Fumarate-FA (PRENATAL 19) 29-1 MG CHEW Chew 1 tablet by mouth daily. 08/10/17 11/08/17  Ronia Hazelett, Charlesetta Ivory, PA-C    Allergies Amoxicillin and Penicillins  No family history on file.  Social History Social History   Tobacco Use  . Smoking status: Never Smoker  . Smokeless tobacco: Never Used  Substance Use Topics  . Alcohol use: No  . Drug use: No    Review of Systems  Constitutional: Negative for fever. Eyes: Negative for visual changes. ENT: Negative  for sore throat. Cardiovascular: Negative for chest pain. Respiratory: Negative for shortness of breath. Gastrointestinal: Reports abdominal cramping. Negative for vomiting and diarrhea. Genitourinary: Negative for dysuria. Reports intermittent pelvic pain. Musculoskeletal: Negative for back pain. Skin: Negative for rash. Neurological: Negative for headaches, focal weakness or numbness. Reports dizziness ____________________________________________  PHYSICAL  EXAM:  VITAL SIGNS: ED Triage Vitals  Enc Vitals Group     BP 08/10/17 1312 113/67     Pulse Rate 08/10/17 1312 95     Resp 08/10/17 1312 18     Temp 08/10/17 1312 (!) 97.5 F (36.4 C)     Temp Source 08/10/17 1312 Oral     SpO2 08/10/17 1312 100 %     Weight 08/10/17 1313 115 lb (52.2 kg)     Height 08/10/17 1313 5\' 2"  (1.575 m)     Head Circumference --      Peak Flow --      Pain Score 08/10/17 1313 2     Pain Loc --      Pain Edu? --      Excl. in GC? --     Constitutional: Alert and oriented. Well appearing and in no distress. Head: Normocephalic and atraumatic. Eyes: Conjunctivae are normal. Normal extraocular movements Cardiovascular: Normal rate, regular rhythm. Normal distal pulses. Respiratory: Normal respiratory effort. No wheezes/rales/rhonchi. Gastrointestinal: Soft and nontender. No distention. GU: Deferred Musculoskeletal: Nontender with normal range of motion in all extremities.  Neurologic:  Normal gait without ataxia. Normal speech and language. No gross focal neurologic deficits are appreciated. Skin:  Skin is warm, dry and intact. No rash noted. Psychiatric: Mood and affect are normal. Patient exhibits appropriate insight and judgment. ____________________________________________   LABS (pertinent positives/negatives) Labs Reviewed  COMPREHENSIVE METABOLIC PANEL - Abnormal; Notable for the following components:      Result Value   Alkaline Phosphatase 37 (*)    All other components within normal limits  URINALYSIS, COMPLETE (UACMP) WITH MICROSCOPIC - Abnormal; Notable for the following components:   Color, Urine YELLOW (*)    APPearance CLOUDY (*)    Leukocytes, UA TRACE (*)    Bacteria, UA RARE (*)    Squamous Epithelial / LPF 0-5 (*)    All other components within normal limits  POCT PREGNANCY, URINE - Abnormal; Notable for the following components:   Preg Test, Ur POSITIVE (*)    All other components within normal limits  LIPASE, BLOOD  CBC   HCG, QUANTITATIVE, PREGNANCY  ____________________________________________   RADIOLOGY Transvaginal US OB <14wks  IMPRESSION: Single viable intrauterine pregnancy at 9 weeks 0 days. ____________________________________________  INITIAL IMPRESSION / ASSESSMENT AND PLAN / ED COURSE  DDX: IUP, Ectopic, GERD, cholecystitis  Female patient with ED evaluation of intermittent abdominal pain over the last week or so she denies any vaginal bleeding, nausea, vomiting.  Her exam, labs, ultrasound are reassuring at this time.  Confirms a single IUP without signs of subchorionic hemorrhage.  Patient is discharged with a prescription for prenatal vitamins and Reglan for nausea.  She will establish prenatal care with a local health department.  Return precautions have been reviewed. ____________________________________________  FINAL CLINICAL IMPRESSION(S) / ED DIAGNOSES  Final diagnoses:  Normal IUP (intrauterine pregnancy) on prenatal ultrasound, first trimester     Lissa HoardMenshew, Akeia Perot V Bacon, PA-C 08/10/17 1814    Jene EveryKinner, Robert, MD 08/10/17 [phone]

## 2017-09-18 ENCOUNTER — Ambulatory Visit (INDEPENDENT_AMBULATORY_CARE_PROVIDER_SITE_OTHER): Payer: Medicaid Other | Admitting: Obstetrics and Gynecology

## 2017-09-18 ENCOUNTER — Encounter: Payer: Self-pay | Admitting: Obstetrics and Gynecology

## 2017-09-18 ENCOUNTER — Other Ambulatory Visit: Payer: Self-pay

## 2017-09-18 VITALS — BP 107/65 | HR 98 | Ht 62.0 in | Wt 127.5 lb

## 2017-09-18 DIAGNOSIS — Z3401 Encounter for supervision of normal first pregnancy, first trimester: Secondary | ICD-10-CM | POA: Diagnosis not present

## 2017-09-18 DIAGNOSIS — Z124 Encounter for screening for malignant neoplasm of cervix: Secondary | ICD-10-CM

## 2017-09-18 DIAGNOSIS — Z1379 Encounter for other screening for genetic and chromosomal anomalies: Secondary | ICD-10-CM

## 2017-09-18 DIAGNOSIS — Z1389 Encounter for screening for other disorder: Secondary | ICD-10-CM

## 2017-09-18 DIAGNOSIS — Z113 Encounter for screening for infections with a predominantly sexual mode of transmission: Secondary | ICD-10-CM

## 2017-09-18 DIAGNOSIS — O0932 Supervision of pregnancy with insufficient antenatal care, second trimester: Secondary | ICD-10-CM

## 2017-09-18 LAB — POCT URINALYSIS DIPSTICK
Bilirubin, UA: NEGATIVE
Clarity, UA: NEGATIVE
Glucose, UA: NEGATIVE
Ketones, UA: NEGATIVE
Leukocytes, UA: NEGATIVE
NITRITE UA: NEGATIVE
PH UA: 6 (ref 5.0–8.0)
RBC UA: NEGATIVE
Spec Grav, UA: >=1.03 — AB (ref 1.010–1.025)
UROBILINOGEN UA: 0.2 U/dL

## 2017-09-18 NOTE — Progress Notes (Signed)
[name] is doing well no concerns.

## 2017-09-18 NOTE — Progress Notes (Signed)
OBSTETRIC INITIAL PRENATAL VISIT  Subjective:    Emma Cooper is being seen today for her first obstetrical visit.  This is not a planned pregnancy. Patient is ambivalent She is a G1P0000 female at [redacted]w[redacted]d gestation, Estimated Date of Delivery: 03/13/18 with Patient's last menstrual period was 06/06/2017. (consistent with 9 week ER sono). Her obstetrical history is significant for late entry to prenatal care. Relationship with FOB: significant other, living together. Patient is unsure if she intends to breast feed. Pregnancy history fully reviewed.    OB History  Gravida Para Term Preterm AB Living  1 0 0 0 0 0  SAB TAB Ectopic Multiple Live Births  0 0 0 0 0    # Outcome Date GA Lbr Len/2nd Weight Sex Delivery Anes PTL Lv  1 Current             Gynecologic History:  Last pap smear was Patient has never had a pap smear. Denies history of STIs.  Contraception: none   Past Medical History:  Diagnosis Date  . Asthma    childhood asthma  . Vasovagal syncope      Family History  Problem Relation Age of Onset  . Diabetes Maternal Grandfather   . Cancer Neg Hx   . Hypertension Neg Hx   . Thyroid disease Neg Hx      Past Surgical History:  Procedure Laterality Date  . NO PAST SURGERIES      Social History   Socioeconomic History  . Marital status: Single    Spouse name: Not on file  . Number of children: Not on file  . Years of education: Not on file  . Highest education level: Not on file  Occupational History  . Not on file  Social Needs  . Financial resource strain: Not on file  . Food insecurity:    Worry: Not on file    Inability: Not on file  . Transportation needs:    Medical: Not on file    Non-medical: Not on file  Tobacco Use  . Smoking status: Never Smoker  . Smokeless tobacco: Never Used  Substance and Sexual Activity  . Alcohol use: No  . Drug use: No  . Sexual activity: Yes    Birth control/protection: None  Lifestyle  . Physical  activity:    Days per week: Not on file    Minutes per session: Not on file  . Stress: Not on file  Relationships  . Social connections:    Talks on phone: Not on file    Gets together: Not on file    Attends religious service: Not on file    Active member of club or organization: Not on file    Attends meetings of clubs or organizations: Not on file    Relationship status: Not on file  . Intimate partner violence:    Fear of current or ex partner: Not on file    Emotionally abused: Not on file    Physically abused: Not on file    Forced sexual activity: Not on file  Other Topics Concern  . Not on file  Social History Narrative   ** Merged History Encounter **         Current Outpatient Medications on File Prior to Visit  Medication Sig Dispense Refill  . albuterol (PROVENTIL HFA;VENTOLIN HFA) 108 (90 BASE) MCG/ACT inhaler Inhale 2 puffs into the lungs every 4 (four) hours as needed for wheezing or shortness of breath. 1 Inhaler 1  .  metoCLOPramide (REGLAN) 5 MG tablet Take 1 tablet (5 mg total) by mouth every 8 (eight) hours as needed for nausea or vomiting. 15 tablet 0  . Prenatal Vit-Fe Fumarate-FA (PRENATAL 19) 29-1 MG CHEW Chew 1 tablet by mouth daily. 90 tablet 1   No current facility-administered medications on file prior to visit.      Allergies  Allergen Reactions  . Amoxicillin Other (See Comments)    Childhood Reaction  . Penicillins Other (See Comments)    Childhood Reaction      Review of Systems General:Not Present- Fever, Weight Loss and Weight Gain. Skin:Not Present- Rash. HEENT:Not Present- Blurred Vision, Headache and Bleeding Gums. Respiratory:Not Present- Difficulty Breathing. Breast:Not Present- Breast Mass. Cardiovascular:Not Present- Chest Pain, Elevated Blood Pressure, Fainting / Blacking Out and Shortness of Breath. Gastrointestinal:Not Present- Abdominal Pain, Constipation, Nausea and Vomiting. Female Genitourinary:Not Present-  Frequency, Painful Urination, Pelvic Pain, Vaginal Bleeding, Vaginal Discharge, Contractions, regular, Fetal Movements Decreased, Urinary Complaints and Vaginal Fluid. Musculoskeletal:Not Present- Back Pain and Leg Cramps. Neurological:Not Present- Dizziness. Psychiatric:Not Present- Depression.     Objective:   Blood pressure 107/65, pulse 98, weight 127 lb 8 oz (57.8 kg), last menstrual period 06/06/2017.  Body mass index is 23.32 kg/m.   General Appearance:    Alert, cooperative, no distress, appears stated age  Head:    Normocephalic, without obvious abnormality, atraumatic  Eyes:    PERRL, conjunctiva/corneas clear, EOM's intact, both eyes  Ears:    Normal external ear canals, both ears  Nose:   Nares normal, septum midline, mucosa normal, no drainage or sinus tenderness  Throat:   Lips, mucosa, and tongue normal; teeth and gums normal  Neck:   Supple, symmetrical, trachea midline, no adenopathy; thyroid: no enlargement/tenderness/nodules; no carotid bruit or JVD  Back:     Symmetric, no curvature, ROM normal, no CVA tenderness  Lungs:     Clear to auscultation bilaterally, respirations unlabored  Chest Wall:    No tenderness or deformity   Heart:    Regular rate and rhythm, S1 and S2 normal, no murmur, rub or gallop  Breast Exam:    No tenderness, masses, or nipple abnormality  Abdomen:     Soft, non-tender, bowel sounds active all four quadrants, no masses, no organomegaly.  FH 15.  FHT 154 bpm.  Genitalia:    Pelvic:external genitalia normal, vagina without lesions, discharge, or tenderness, rectovaginal septum  normal. Cervix normal in appearance, no cervical motion tenderness, no adnexal masses or tenderness.  Pregnancy positive findings: uterine enlargement: 15 wk size, nontender.   Rectal:    Normal external sphincter.  No hemorrhoids appreciated. Internal exam not done.   Extremities:   Extremities normal, atraumatic, no cyanosis or edema  Pulses:   2+ and symmetric all  extremities  Skin:   Skin color, texture, turgor normal, no rashes or lesions  Lymph nodes:   Cervical, supraclavicular, and axillary nodes normal  Neurologic:   CNII-XII intact, normal strength, sensation and reflexes throughout       Assessment:    Pregnancy at 14 and 6/7 weeks   Late entry to prenatal care  Plan:    Initial prenatal labs ordered. Pap smear performed today.  Prenatal vitamins encouraged. Problem list reviewed and updated. New OB counseling:  The patient has been given an overview regarding routine prenatal care.  Recommendations regarding diet, weight gain, and exercise in pregnancy were given. Prenatal testing, optional genetic testing, and ultrasound use in pregnancy were reviewed.  Desires cell-free DNA:  MaterniT21 ordered.  Scheduled for anatomy scan in 5 weeks.  Benefits of Breast Feeding were discussed. The patient is encouraged to consider nursing her baby post partum. Discussed patient's ambivalence regarding the pregnancy. Declines termination, however is considering adoption. Will continue to address at future visits.  Follow up in 4 weeks.   The patient has Medicaid.  [place] Medicaid Risk Screening Form completed today  50% of 30 min visit spent on counseling and coordination of care.      Hildred Laser, MD [facility]

## 2017-09-19 LAB — DRUG PROFILE, UR, 9 DRUGS (LABCORP)
AMPHETAMINES, URINE: NEGATIVE ng/mL
BARBITURATE QUANT UR: NEGATIVE ng/mL
BENZODIAZEPINE QUANT UR: NEGATIVE ng/mL
COCAINE (METAB.): NEGATIVE ng/mL
Cannabinoid Quant, Ur: NEGATIVE ng/mL
Methadone Screen, Urine: NEGATIVE ng/mL
OPIATE QUANT UR: NEGATIVE ng/mL
PCP Quant, Ur: NEGATIVE ng/mL
Propoxyphene: NEGATIVE ng/mL

## 2017-09-19 LAB — RUBELLA SCREEN

## 2017-09-19 LAB — MICROSCOPIC EXAMINATION: CASTS: NONE SEEN /LPF

## 2017-09-19 LAB — ANTIBODY SCREEN: ANTIBODY SCREEN: NEGATIVE

## 2017-09-19 LAB — URINALYSIS, ROUTINE W REFLEX MICROSCOPIC
Bilirubin, UA: NEGATIVE
Glucose, UA: NEGATIVE
KETONES UA: NEGATIVE
Nitrite, UA: NEGATIVE
PH UA: 5.5 (ref 5.0–7.5)
PROTEIN UA: NEGATIVE
RBC, UA: NEGATIVE
SPEC GRAV UA: 1.028 (ref 1.005–1.030)
UUROB: 1 mg/dL (ref 0.2–1.0)

## 2017-09-19 LAB — CBC WITH DIFFERENTIAL/PLATELET
Basophils Absolute: 0 10*3/uL (ref 0.0–0.2)
Basos: 0 %
EOS (ABSOLUTE): 0.7 10*3/uL — ABNORMAL HIGH (ref 0.0–0.4)
EOS: 8 %
HEMATOCRIT: 38.9 % (ref 34.0–46.6)
HEMOGLOBIN: 13 g/dL (ref 11.1–15.9)
IMMATURE GRANULOCYTES: 0 %
Immature Grans (Abs): 0 10*3/uL (ref 0.0–0.1)
Lymphocytes Absolute: 2.4 10*3/uL (ref 0.7–3.1)
Lymphs: 28 %
MCH: 28.7 pg (ref 26.6–33.0)
MCHC: 33.4 g/dL (ref 31.5–35.7)
MCV: 86 fL (ref 79–97)
MONOCYTES: 5 %
Monocytes Absolute: 0.4 10*3/uL (ref 0.1–0.9)
NEUTROS PCT: 59 %
Neutrophils Absolute: 5.1 10*3/uL (ref 1.4–7.0)
Platelets: 303 10*3/uL (ref 150–379)
RBC: 4.53 x10E6/uL (ref 3.77–5.28)
RDW: 13.9 % (ref 12.3–15.4)
WBC: 8.7 10*3/uL (ref 3.4–10.8)

## 2017-09-19 LAB — NICOTINE SCREEN, URINE: Cotinine Ql Scrn, Ur: NEGATIVE ng/mL

## 2017-09-19 LAB — ABO AND RH: Rh Factor: POSITIVE

## 2017-09-19 LAB — RPR: RPR Ser Ql: NONREACTIVE

## 2017-09-19 LAB — VARICELLA ZOSTER ANTIBODY, IGG: Varicella zoster IgG: <135 index — ABNORMAL LOW (ref >165)

## 2017-09-19 LAB — HEPATITIS B SURFACE ANTIGEN: HEP B S AG: NEGATIVE

## 2017-09-19 LAB — HIV ANTIBODY (ROUTINE TESTING W REFLEX): HIV Screen 4th Generation wRfx: NONREACTIVE

## 2017-09-20 LAB — GC/CHLAMYDIA PROBE AMP
Chlamydia trachomatis, NAA: NEGATIVE
Neisseria gonorrhoeae by PCR: NEGATIVE

## 2017-09-21 DIAGNOSIS — Z3401 Encounter for supervision of normal first pregnancy, first trimester: Secondary | ICD-10-CM | POA: Insufficient documentation

## 2017-09-21 DIAGNOSIS — O0932 Supervision of pregnancy with insufficient antenatal care, second trimester: Secondary | ICD-10-CM | POA: Insufficient documentation

## 2017-09-21 LAB — URINE CULTURE, OB REFLEX

## 2017-09-21 LAB — CULTURE, OB URINE

## 2017-09-23 LAB — MATERNIT21  PLUS CORE+ESS+SCA, BLOOD
CHROMOSOME 18: NEGATIVE
Chromosome 13: NEGATIVE
Chromosome 21: NEGATIVE
Y CHROMOSOME: NOT DETECTED

## 2017-09-26 LAB — PAP IG, CT-NG, RFX HPV ASCU
Chlamydia, Nuc. Acid Amp: NEGATIVE
Gonococcus by Nucleic Acid Amp: NEGATIVE
PAP SMEAR COMMENT: 0

## 2017-09-26 LAB — HPV DNA PROBE HIGH RISK, AMPLIFIED: HPV, HIGH-RISK: POSITIVE — AB

## 2017-09-28 ENCOUNTER — Telehealth: Payer: Self-pay

## 2017-09-28 NOTE — Telephone Encounter (Signed)
-----   Message from Hildred LaserAnika Cherry, MD sent at 09/26/2017 11:54 PM EDT ----- Please inform patient of abnormal pap smear. Is ASCUS, but HPV+.  She will just need to have a repeat pap smear again in 1 year. She also appears to have yeast on her pap smear. Can treat with Diflucan 1 tablet.

## 2017-10-11 ENCOUNTER — Other Ambulatory Visit: Payer: Self-pay

## 2017-10-11 MED ORDER — FLUCONAZOLE 100 MG PO TABS: 100.0000 mg | ORAL_TABLET | Freq: Every day | ORAL | 0 refills | Status: DC

## 2017-10-16 ENCOUNTER — Encounter: Payer: Self-pay | Admitting: Obstetrics and Gynecology

## 2017-10-16 ENCOUNTER — Ambulatory Visit (INDEPENDENT_AMBULATORY_CARE_PROVIDER_SITE_OTHER): Payer: Medicaid Other | Admitting: Obstetrics and Gynecology

## 2017-10-16 VITALS — BP 102/64 | HR 97 | Wt 133.6 lb

## 2017-10-16 DIAGNOSIS — Z3401 Encounter for supervision of normal first pregnancy, first trimester: Secondary | ICD-10-CM

## 2017-10-16 DIAGNOSIS — N390 Urinary tract infection, site not specified: Secondary | ICD-10-CM

## 2017-10-16 DIAGNOSIS — F329 Major depressive disorder, single episode, unspecified: Secondary | ICD-10-CM

## 2017-10-16 DIAGNOSIS — O99342 Other mental disorders complicating pregnancy, second trimester: Secondary | ICD-10-CM

## 2017-10-16 DIAGNOSIS — F32A Depression, unspecified: Secondary | ICD-10-CM

## 2017-10-16 LAB — POCT URINALYSIS DIPSTICK
BILIRUBIN UA: NEGATIVE
Blood, UA: NEGATIVE
GLUCOSE UA: NEGATIVE
Ketones, UA: NEGATIVE
Leukocytes, UA: NEGATIVE
Nitrite, UA: POSITIVE
ODOR: NEGATIVE
Protein, UA: NEGATIVE
Spec Grav, UA: 1.01 (ref 1.010–1.025)
Urobilinogen, UA: 0.2 E.U./dL
pH, UA: 6.5 (ref 5.0–8.0)

## 2017-10-16 MED ORDER — NITROFURANTOIN MONOHYD MACRO 100 MG PO CAPS: 100.0000 mg | ORAL_CAPSULE | Freq: Two times a day (BID) | ORAL | 0 refills | Status: DC

## 2017-10-16 NOTE — Progress Notes (Signed)
ROB:  Toothache - refr to dentist.  Also c/o mood issues c/w early depression - pt want counseling - refer by marlyn. FAS scheduled.  Frequency urgency-UA consistent with UTI-Macrobid given.

## 2017-10-18 ENCOUNTER — Telehealth: Payer: Self-pay | Admitting: Obstetrics and Gynecology

## 2017-10-18 MED ORDER — PRENATAL 19 29-1 MG PO CHEW: 1.0000 | CHEWABLE_TABLET | Freq: Every day | ORAL | 3 refills | Status: AC

## 2017-10-18 NOTE — Telephone Encounter (Signed)
The patient called and stated that her prenatal vitamins were never sent into her pharmacy and she would like them sent in. Please advise.

## 2017-10-18 NOTE — Telephone Encounter (Signed)
PNV erx.   LM on cell. Home number not accepting calls.

## 2017-10-19 LAB — AFP, SERUM, OPEN SPINA BIFIDA
AFP MoM: 0.67
AFP Value: 30.7 ng/mL
Gest. Age on Collection Date: 18 weeks
Maternal Age At EDD: 22.5 yr
OSBR RISK 1 IN: 10000
TEST RESULTS AFP: NEGATIVE
Weight: 133 [lb_av]

## 2017-10-20 ENCOUNTER — Emergency Department
Admission: EM | Admit: 2017-10-20 | Discharge: 2017-10-20 | Disposition: A | Discharge status: Home / Self Care | Payer: Medicaid Other | Attending: Emergency Medicine | Admitting: Emergency Medicine

## 2017-10-20 ENCOUNTER — Encounter: Payer: Self-pay | Admitting: *Deleted

## 2017-10-20 ENCOUNTER — Other Ambulatory Visit: Payer: Self-pay

## 2017-10-20 DIAGNOSIS — K0889 Other specified disorders of teeth and supporting structures: Secondary | ICD-10-CM | POA: Diagnosis present

## 2017-10-20 DIAGNOSIS — K047 Periapical abscess without sinus: Secondary | ICD-10-CM | POA: Diagnosis not present

## 2017-10-20 DIAGNOSIS — J45909 Unspecified asthma, uncomplicated: Secondary | ICD-10-CM | POA: Insufficient documentation

## 2017-10-20 MED ORDER — CLINDAMYCIN HCL 300 MG PO CAPS: 300.0000 mg | ORAL_CAPSULE | Freq: Three times a day (TID) | ORAL | 0 refills | Status: AC

## 2017-10-20 NOTE — ED Notes (Signed)
Pt reports she went to the dentist 2 days ago for a check up and cleaning. Pt reports swelling to the right side of her face since last night. Pt reports she is supposed to be getting a tooth pulled on the right side this week.

## 2017-10-20 NOTE — ED Triage Notes (Signed)
Pt c/o pain L R jaw and swelling. Pt states yesterday she had a fever. Pt has dental appt on next Tues. Pt is [redacted] wks pregnant. Pt took tylenol for fever, yesterday.  Pt drove self to ED today and is not accompanied by anyone. Pt c/o difficulty swallowing w/ the dental pain.

## 2017-10-20 NOTE — ED Provider Notes (Signed)
[facility] Emergency Department Provider Note ____________________________________________  Time seen: Approximately 7:04 PM  I have reviewed the triage vital signs and the nursing notes.   HISTORY  Chief Complaint Dental Pain   HPI Emma Cooper is a 22 y.o. female who presents to the emergency department for evaluation and treatment of dental pain.  She is [redacted] weeks pregnant.  She was evaluated by her dentist a couple of days ago for a checkup and a cleaning and did not have any pain or issues at that time.  Since then, she has had pain in the right lower jaw and swelling to the right side of her face that started  last night.  She scheduled an appointment to be seen by her dentist but cannot be seen until Tuesday.  She has taken Tylenol but otherwise has not had anything for pain.  Past Medical History:  Diagnosis Date  . Asthma    childhood asthma  . Vasovagal syncope     Patient Active Problem List   Diagnosis Date Noted  . Late prenatal care affecting pregnancy in second trimester 09/21/2017  . Encounter for supervision of normal first pregnancy in first trimester 09/21/2017    Past Surgical History:  Procedure Laterality Date  . NO PAST SURGERIES      Prior to Admission medications   Medication Sig Start Date End Date Taking? Authorizing Provider  acetaminophen (TYLENOL) 500 MG tablet Take 500 mg by mouth every 6 (six) hours as needed.    [provider]  albuterol (PROVENTIL HFA;VENTOLIN HFA) 108 (90 BASE) MCG/ACT inhaler Inhale 2 puffs into the lungs every 4 (four) hours as needed for wheezing or shortness of breath. 03/22/14   Molpus, John, MD  clindamycin (CLEOCIN) 300 MG capsule Take 1 capsule (300 mg total) by mouth 3 (three) times daily for 10 days. 10/20/17 10/30/17  Timberly Yott, Kasandra Knudsen, FNP  metoCLOPramide (REGLAN) 5 MG tablet Take 1 tablet (5 mg total) by mouth every 8 (eight) hours as needed for nausea or vomiting. 08/10/17    Menshew, Charlesetta Ivory, PA-C  nitrofurantoin, macrocrystal-monohydrate, (MACROBID) 100 MG capsule Take 1 capsule (100 mg total) by mouth 2 (two) times daily. 10/16/17   Defrancesco, Prentice Docker, MD  Prenatal Vit-Fe Fumarate-FA (PRENATAL 19) 29-1 MG CHEW Chew 1 tablet by mouth daily. 10/18/17 01/16/18  Defrancesco, Prentice Docker, MD    Allergies Amoxicillin and Penicillins  Family History  Problem Relation Age of Onset  . Diabetes Maternal Grandfather   . Cancer Neg Hx   . Hypertension Neg Hx   . Thyroid disease Neg Hx     Social History Social History   Tobacco Use  . Smoking status: Never Smoker  . Smokeless tobacco: Never Used  Substance Use Topics  . Alcohol use: No  . Drug use: No    Review of Systems Constitutional: Negative for fever ENT: Positive for dental pain Musculoskeletal: Negative for trismus Skin: Positive for mild facial swelling ____________________________________________   PHYSICAL EXAM:  VITAL SIGNS: ED Triage Vitals  Enc Vitals Group     BP      Pulse      Resp      Temp      Temp src      SpO2      Weight      Height      Head Circumference      Peak Flow      Pain Score      Pain  Loc      Pain Edu?      Excl. in GC?     Constitutional: Alert and oriented. Well appearing and in no acute distress. Eyes: Conjunctivae are clear without discharge or drainage Mouth/Throat: Mild facial swelling in the right lower jaw without erythema. Periodontal Exam    Hematological/Lymphatic/Immunilogical: No cervical adenopathy Respiratory: Respiratory rate even and unlabored Musculoskeletal: No trismus of the jaw Neurologic: Awake, alert, oriented x4. Skin: Intact and without erythema Psychiatric: Affect and behavior are appropriate  ____________________________________________   LABS (all labs ordered are listed, but only abnormal results are displayed)  Labs Reviewed - No data to  display ____________________________________________   RADIOLOGY  Not indicated ____________________________________________   PROCEDURES  Procedure(s) performed:   Procedures  Critical Care performed: No ____________________________________________   INITIAL IMPRESSION / ASSESSMENT AND PLAN / ED COURSE  Emma Cooper is a 22 y.o. female who presents to the emergency department for evaluation and treatment of dental pain.  She is pen allergic and therefore will be treated with clindamycin.  She was encouraged to keep the dental appointment she has scheduled on Tuesday.  She was advised to return to the emergency department for symptoms of change or worsen.  Pertinent labs & imaging results that were available during my care of the patient were reviewed by me and considered in my medical decision making (see chart for details).  ____________________________________________   FINAL CLINICAL IMPRESSION(S) / ED DIAGNOSES  Final diagnoses:  Dental abscess    Discharge Medication List as of 10/20/2017  7:41 PM    START taking these medications   Details  clindamycin (CLEOCIN) 300 MG capsule Take 1 capsule (300 mg total) by mouth 3 (three) times daily for 10 days., Starting Sat 10/20/2017, Until Tue 10/30/2017, Print        If controlled substance prescribed during this visit, 12 month history viewed on the [facility] prior to issuing an initial prescription for Schedule II or III opiod.  Note:  This document was prepared using Dragon voice recognition software and may include unintentional dictation errors.    Chinita Pester, FNP 10/20/17 2302    Sharyn Creamer, MD 10/21/17 [phone]

## 2017-10-21 LAB — URINE CULTURE

## 2017-10-23 ENCOUNTER — Ambulatory Visit (INDEPENDENT_AMBULATORY_CARE_PROVIDER_SITE_OTHER): Payer: Medicaid Other

## 2017-10-23 DIAGNOSIS — Z3401 Encounter for supervision of normal first pregnancy, first trimester: Secondary | ICD-10-CM | POA: Diagnosis not present

## 2017-11-05 ENCOUNTER — Other Ambulatory Visit: Payer: Self-pay | Admitting: Obstetrics and Gynecology

## 2017-11-05 DIAGNOSIS — O35DXX0 Maternal care for other (suspected) fetal abnormality and damage, fetal gastrointestinal anomalies, not applicable or unspecified: Secondary | ICD-10-CM

## 2017-11-05 DIAGNOSIS — O358XX0 Maternal care for other (suspected) fetal abnormality and damage, not applicable or unspecified: Secondary | ICD-10-CM

## 2017-11-05 NOTE — Progress Notes (Unsigned)
Echogenic focus noted on fetal ultrasound. Will repeat scan in 3 weeks.

## 2017-11-07 ENCOUNTER — Encounter (INDEPENDENT_AMBULATORY_CARE_PROVIDER_SITE_OTHER): Payer: Self-pay

## 2017-11-13 ENCOUNTER — Ambulatory Visit (INDEPENDENT_AMBULATORY_CARE_PROVIDER_SITE_OTHER): Payer: Medicaid Other | Admitting: Obstetrics and Gynecology

## 2017-11-13 VITALS — BP 109/62 | HR 95 | Wt 140.7 lb

## 2017-11-13 DIAGNOSIS — O9989 Other specified diseases and conditions complicating pregnancy, childbirth and the puerperium: Secondary | ICD-10-CM

## 2017-11-13 DIAGNOSIS — O35DXX0 Maternal care for other (suspected) fetal abnormality and damage, fetal gastrointestinal anomalies, not applicable or unspecified: Secondary | ICD-10-CM

## 2017-11-13 DIAGNOSIS — O358XX0 Maternal care for other (suspected) fetal abnormality and damage, not applicable or unspecified: Secondary | ICD-10-CM

## 2017-11-13 DIAGNOSIS — R8271 Bacteriuria: Secondary | ICD-10-CM

## 2017-11-13 DIAGNOSIS — M5430 Sciatica, unspecified side: Secondary | ICD-10-CM

## 2017-11-13 DIAGNOSIS — Z3482 Encounter for supervision of other normal pregnancy, second trimester: Secondary | ICD-10-CM

## 2017-11-13 LAB — POCT URINALYSIS DIPSTICK
Bilirubin, UA: NEGATIVE
Glucose, UA: NEGATIVE
Ketones, UA: NEGATIVE
Nitrite, UA: NEGATIVE
PH UA: 7.5 (ref 5.0–8.0)
Protein, UA: NEGATIVE
RBC UA: NEGATIVE
Spec Grav, UA: 1.02 (ref 1.010–1.025)
UROBILINOGEN UA: 0.2 U/dL

## 2017-11-13 NOTE — Progress Notes (Signed)
ROB: Patient c/o back pain of lower back. Discussed pain relief (tylenol, stretches, heating pads, warm baths). Will repeat culture for h/o asymptomatic bacteriuria. Echogenic focus seen on bowel, recommended f/u scan. Will order for next week. RTC in 1 week.

## 2017-11-13 NOTE — Progress Notes (Signed)
Emma Cooper stated that she has been having lower back pain. No other concerns.

## 2017-11-21 ENCOUNTER — Ambulatory Visit (INDEPENDENT_AMBULATORY_CARE_PROVIDER_SITE_OTHER): Payer: Medicaid Other

## 2017-11-21 DIAGNOSIS — O35DXX0 Maternal care for other (suspected) fetal abnormality and damage, fetal gastrointestinal anomalies, not applicable or unspecified: Secondary | ICD-10-CM

## 2017-11-21 DIAGNOSIS — O358XX0 Maternal care for other (suspected) fetal abnormality and damage, not applicable or unspecified: Secondary | ICD-10-CM | POA: Diagnosis not present

## 2017-12-11 ENCOUNTER — Encounter: Payer: Self-pay | Admitting: Obstetrics and Gynecology

## 2017-12-11 ENCOUNTER — Ambulatory Visit (INDEPENDENT_AMBULATORY_CARE_PROVIDER_SITE_OTHER): Payer: Medicaid Other | Admitting: Obstetrics and Gynecology

## 2017-12-11 VITALS — BP 117/73 | HR 106 | Wt 145.0 lb

## 2017-12-11 DIAGNOSIS — Z3482 Encounter for supervision of other normal pregnancy, second trimester: Secondary | ICD-10-CM

## 2017-12-11 DIAGNOSIS — R42 Dizziness and giddiness: Secondary | ICD-10-CM

## 2017-12-11 LAB — POCT URINALYSIS DIPSTICK
Bilirubin, UA: NEGATIVE
Blood, UA: NEGATIVE
Glucose, UA: NEGATIVE
Ketones, UA: NEGATIVE
LEUKOCYTES UA: NEGATIVE
NITRITE UA: NEGATIVE
PH UA: 6 (ref 5.0–8.0)
PROTEIN UA: NEGATIVE
Spec Grav, UA: 1.025 (ref 1.010–1.025)
UROBILINOGEN UA: 0.2 U/dL

## 2017-12-11 NOTE — Progress Notes (Signed)
ROB: No pregnancy complaints.  She does complain of episodes of lightheadedness and almost passing out.  She states this happens approximately once per week.  She says that she has had it her whole life but it is getting worse.  She sometimes is afraid when driving and this occurs she must pull over.  Describes her ears ringing and then losing her hearing as well as feeling flushed immediately prior to the episode.  I described this episode to Dr. Okey DupreEnd and he has recommended an echocardiogram and 2-week proventis monitor.  1 hour GCT in 2 weeks.  Urine for culture next visit.

## 2017-12-11 NOTE — Progress Notes (Signed)
ROB-pt stated that during the night when she is sleeping and attempt to turn over she feel pressure in abd area.

## 2017-12-13 NOTE — Addendum Note (Signed)
Addended by: Elonda HuskyEVANS, Laporcha Marchesi J on: 12/13/2017 01:10 PM   Modules accepted: Orders

## 2017-12-25 ENCOUNTER — Other Ambulatory Visit: Payer: Medicaid Other

## 2017-12-25 DIAGNOSIS — Z3482 Encounter for supervision of other normal pregnancy, second trimester: Secondary | ICD-10-CM

## 2017-12-26 LAB — GLUCOSE, 1 HOUR GESTATIONAL: GESTATIONAL DIABETES SCREEN: 122 mg/dL (ref 65–139)

## 2017-12-26 LAB — CBC
HEMOGLOBIN: 12.2 g/dL (ref 11.1–15.9)
Hematocrit: 37.4 % (ref 34.0–46.6)
MCH: 29 pg (ref 26.6–33.0)
MCHC: 32.6 g/dL (ref 31.5–35.7)
MCV: 89 fL (ref 79–97)
PLATELETS: 259 10*3/uL (ref 150–450)
RBC: 4.21 x10E6/uL (ref 3.77–5.28)
RDW: 13 % (ref 12.3–15.4)
WBC: 11.1 10*3/uL — ABNORMAL HIGH (ref 3.4–10.8)

## 2017-12-26 LAB — RPR: RPR: NONREACTIVE

## 2018-01-08 ENCOUNTER — Encounter: Payer: Medicaid Other | Admitting: Obstetrics and Gynecology

## 2018-06-05 ENCOUNTER — Encounter (HOSPITAL_COMMUNITY): Payer: Self-pay

## 2019-01-17 IMAGING — CR DG CHEST 2V
1 series · 2 of 2 positions shown · non-contrast
Comparison: Chest x-ray dated July 31, 2015.

CLINICAL DATA: Productive cough for the past 4 days.

EXAM:
CHEST  2 VIEW

[Series 1: dg chest 2 view · 0.14mm/px · 2 of 2 slices shown]
[im 1/2]
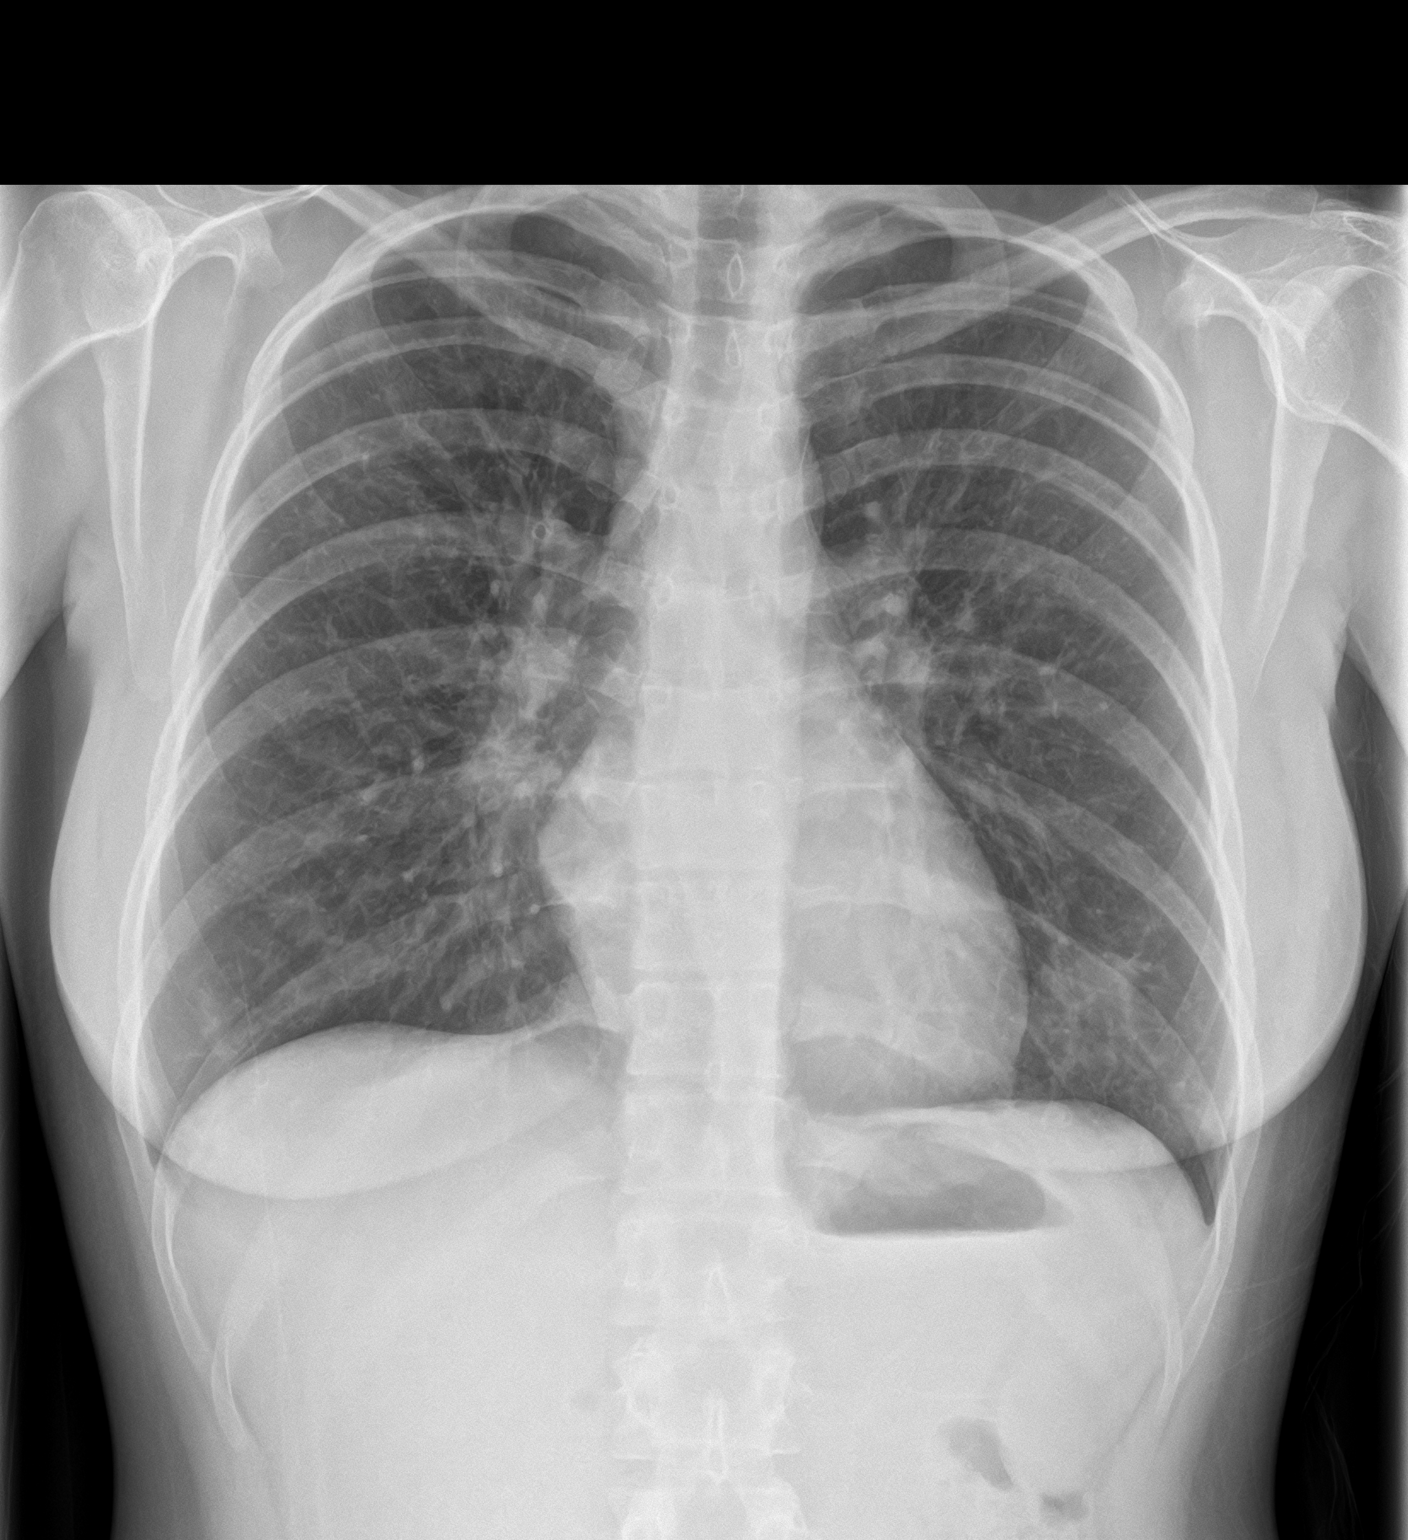
[im 2/2]
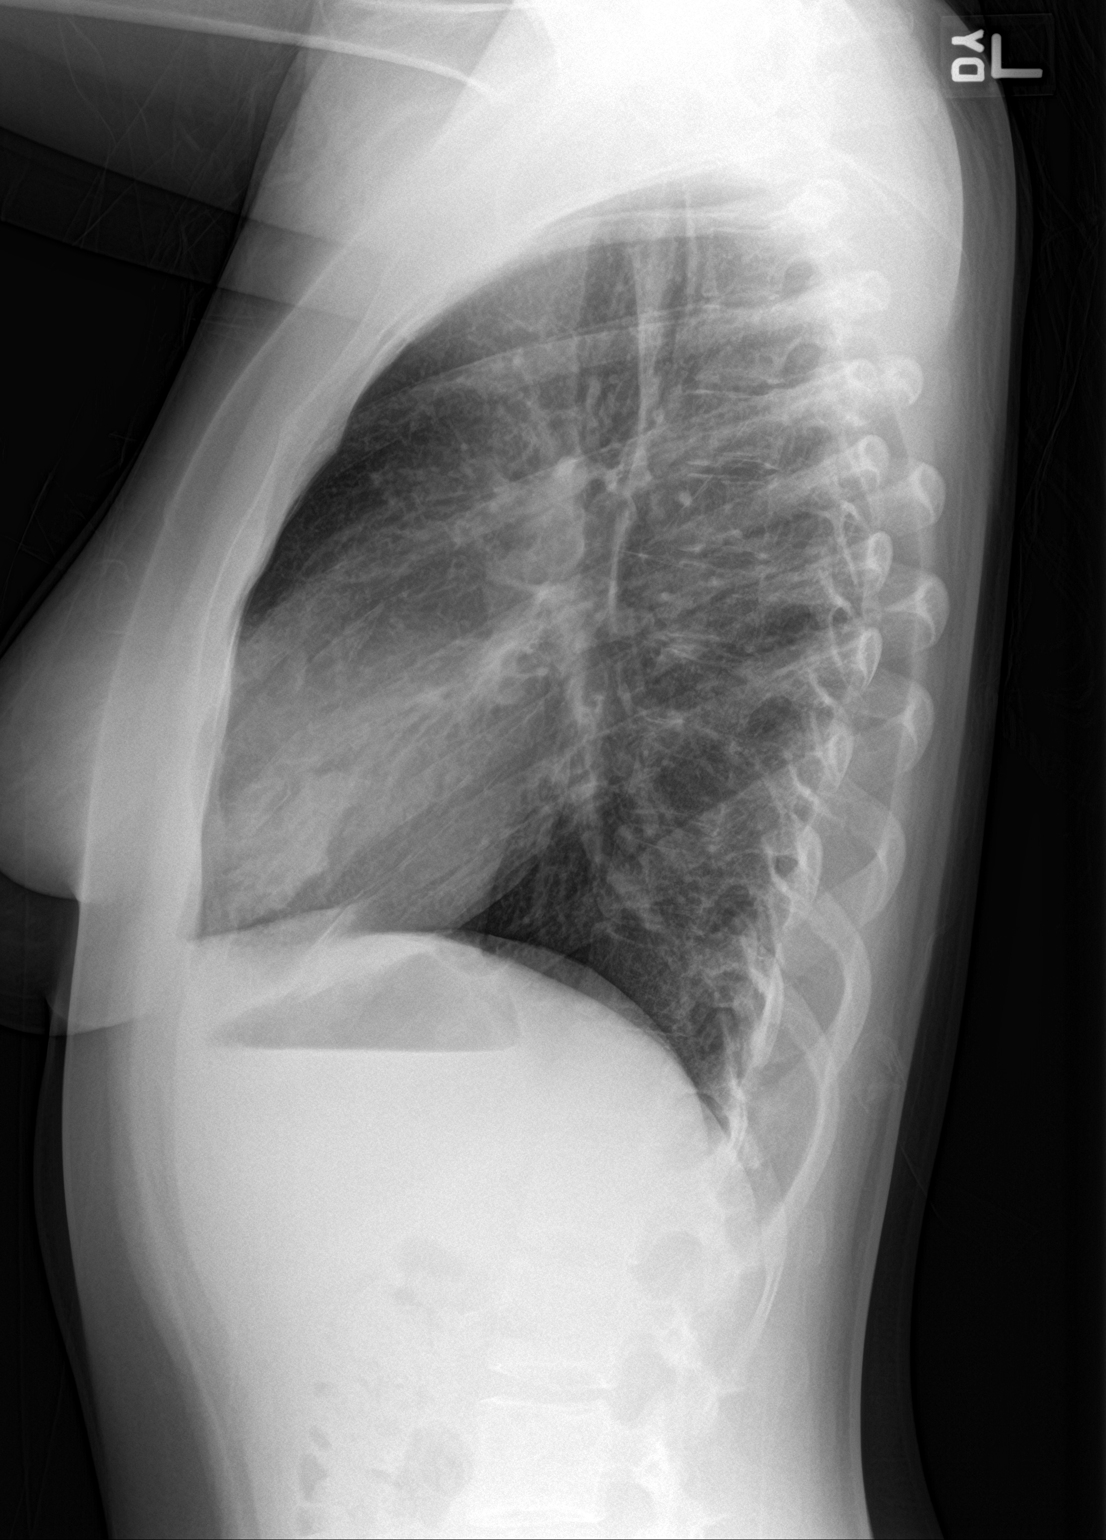

[2 of 2 positions shown; findings below may reference images not displayed]

FINDINGS: The cardiomediastinal silhouette is normal in size. Normal pulmonary
vascularity. Patchy consolidation in the lingula. No pleural
effusion or pneumothorax. No acute osseous abnormality.
IMPRESSION: Lingular pneumonia.

## 2020-12-05 ENCOUNTER — Other Ambulatory Visit: Payer: Self-pay

## 2020-12-05 ENCOUNTER — Emergency Department (HOSPITAL_COMMUNITY)
Admission: EM | Admit: 2020-12-05 | Discharge: 2020-12-06 | Discharge status: Against Medical Advice | Payer: Medicaid Other
